# Patient Record
Sex: Female | Born: 1990 | Race: Black or African American | Hispanic: No | Marital: Single
Health system: Southern US, Community
[De-identification: ages and names within clinical notes are randomized; demographics above are authoritative.]

---

## 2016-07-09 ENCOUNTER — Ambulatory Visit
Admission: RE | Admit: 2016-07-09 | Discharge: 2016-07-09 | Disposition: A | Discharge status: Home / Self Care | Payer: 59 | Source: Ambulatory Visit | Attending: Chiropractic Medicine | Admitting: Chiropractic Medicine

## 2016-07-09 ENCOUNTER — Other Ambulatory Visit: Payer: Self-pay | Admitting: Chiropractic Medicine

## 2016-07-09 DIAGNOSIS — M25569 Pain in unspecified knee: Secondary | ICD-10-CM

## 2016-07-09 DIAGNOSIS — R51 Headache: Secondary | ICD-10-CM

## 2016-07-09 DIAGNOSIS — M542 Cervicalgia: Secondary | ICD-10-CM

## 2016-07-09 DIAGNOSIS — R519 Headache, unspecified: Secondary | ICD-10-CM

## 2016-07-09 DIAGNOSIS — M545 Low back pain: Secondary | ICD-10-CM

## 2018-08-05 IMAGING — CR DG LUMBAR SPINE 2-3V
3 series · 3 of 3 positions shown · non-contrast
Comparison: None.

CLINICAL DATA: Low back pain for several months.

EXAM:
LUMBAR SPINE - 2-3 VIEW

[w lumbar spine ap]
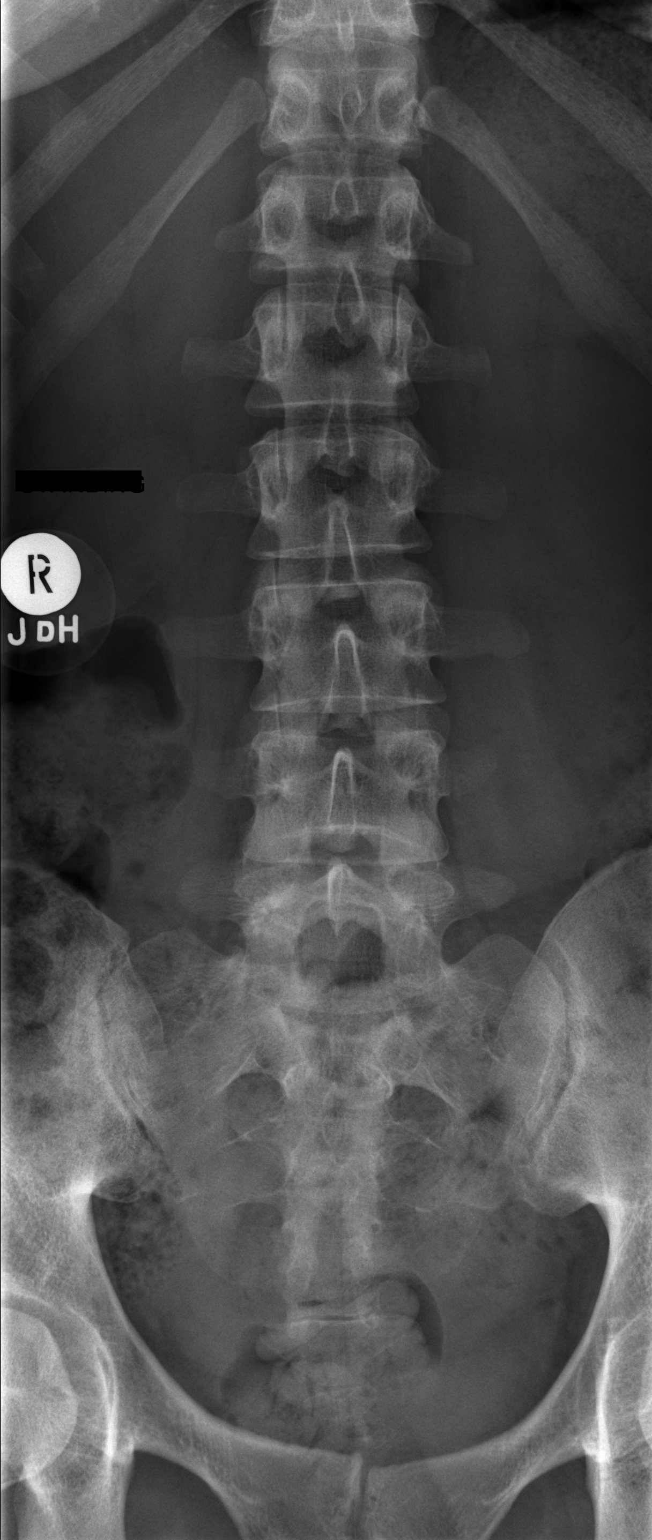

[w lumbar spine lat]
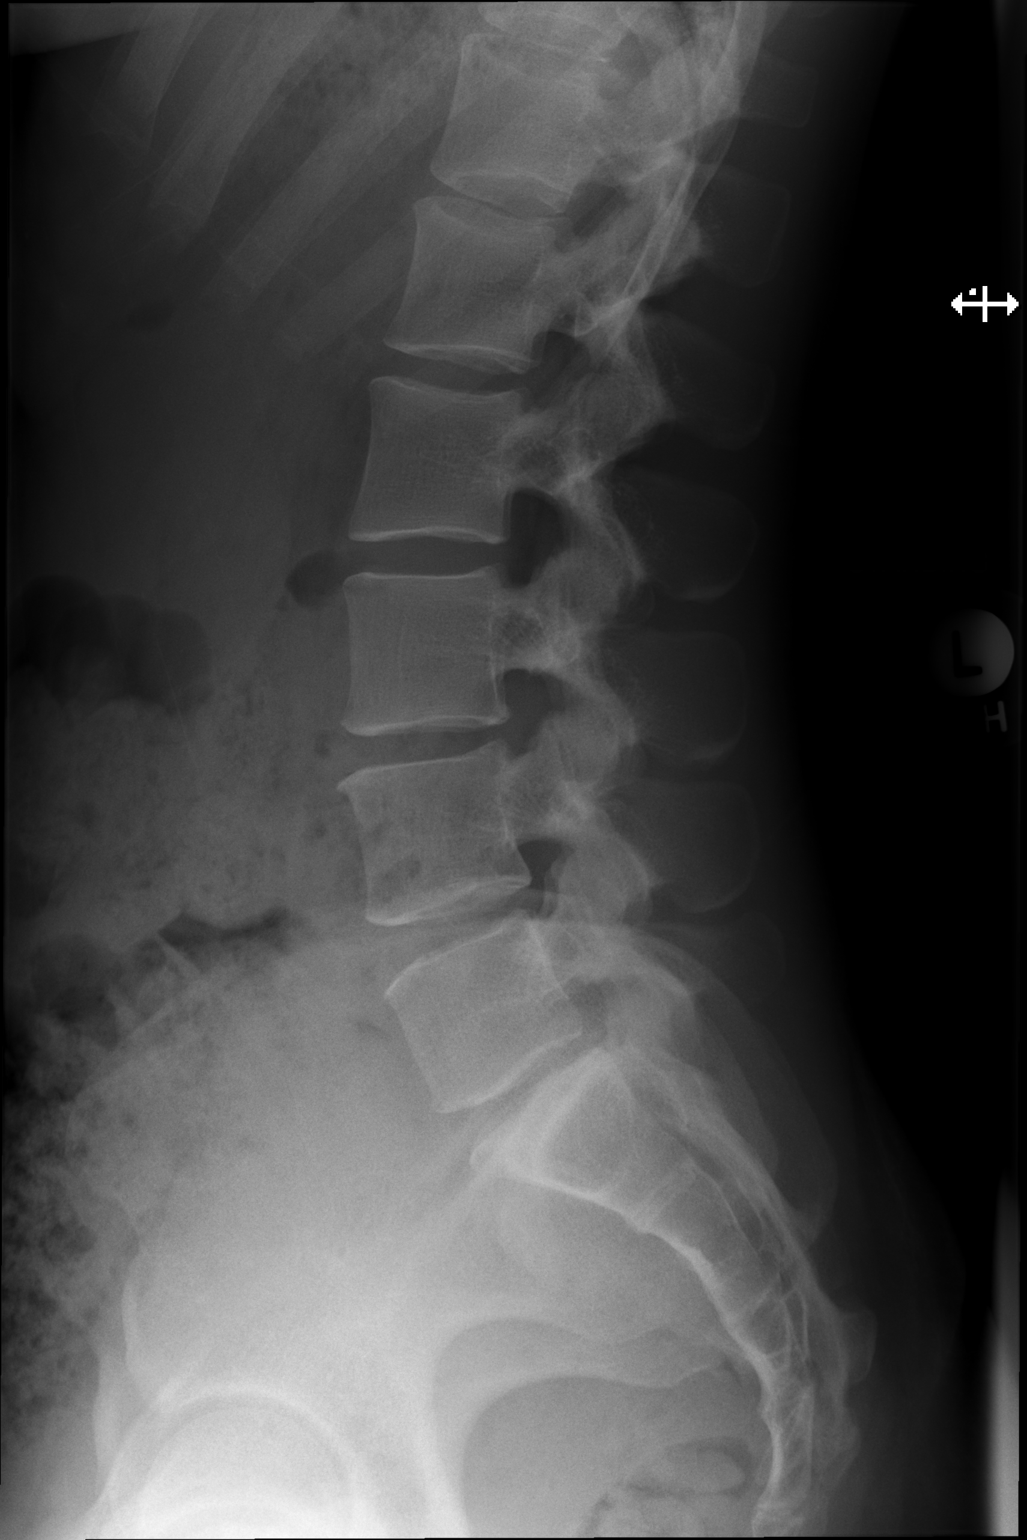

[w lumbar l-5 s-1 spot]
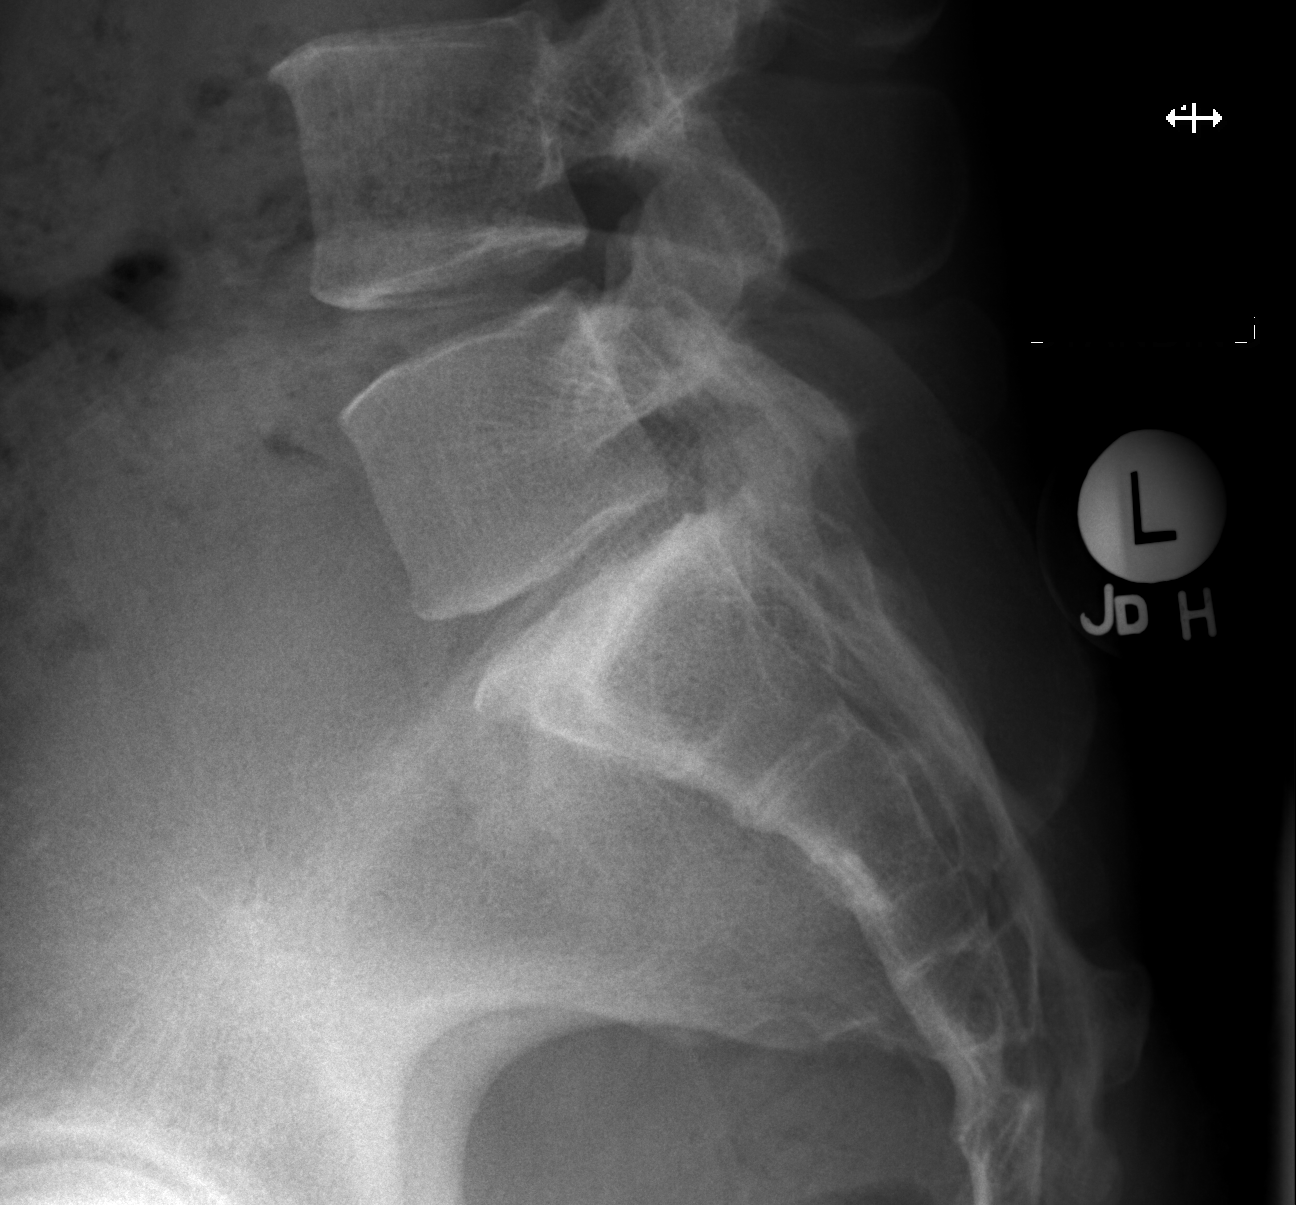

[3 of 3 positions shown; findings below may reference images not displayed]

FINDINGS: Vertebral body height and alignment are maintained. Minimal anterior
endplate spurring is noted at L3-4. There is some irregularity of
subchondral bone about the SI joints bilaterally. Soft tissue
structures appear normal.
IMPRESSION: Very mild degenerative change L3-4.

Likely erosions about the SI joints bilaterally as can be seen in
spondyloarthropathies. Dedicated plain films of the SI joints could
be used for further evaluation.

## 2022-05-27 ENCOUNTER — Ambulatory Visit (INDEPENDENT_AMBULATORY_CARE_PROVIDER_SITE_OTHER): Payer: BC Managed Care – PPO

## 2022-05-27 ENCOUNTER — Ambulatory Visit: Payer: BC Managed Care – PPO | Admitting: Podiatry

## 2022-05-27 DIAGNOSIS — M2011 Hallux valgus (acquired), right foot: Secondary | ICD-10-CM | POA: Diagnosis not present

## 2022-05-27 DIAGNOSIS — M7750 Other enthesopathy of unspecified foot: Secondary | ICD-10-CM

## 2022-05-27 DIAGNOSIS — M21611 Bunion of right foot: Secondary | ICD-10-CM

## 2022-05-27 DIAGNOSIS — M2142 Flat foot [pes planus] (acquired), left foot: Secondary | ICD-10-CM

## 2022-05-27 DIAGNOSIS — M7752 Other enthesopathy of left foot: Secondary | ICD-10-CM

## 2022-05-27 DIAGNOSIS — M2141 Flat foot [pes planus] (acquired), right foot: Secondary | ICD-10-CM

## 2022-05-27 DIAGNOSIS — M2012 Hallux valgus (acquired), left foot: Secondary | ICD-10-CM

## 2022-05-27 DIAGNOSIS — M722 Plantar fascial fibromatosis: Secondary | ICD-10-CM

## 2022-05-27 DIAGNOSIS — M21612 Bunion of left foot: Secondary | ICD-10-CM

## 2022-05-27 DIAGNOSIS — M7751 Other enthesopathy of right foot: Secondary | ICD-10-CM | POA: Diagnosis not present

## 2022-05-27 NOTE — Patient Instructions (Signed)

## 2022-05-28 ENCOUNTER — Encounter: Payer: Self-pay | Admitting: Podiatry

## 2022-05-28 NOTE — Progress Notes (Signed)
  Subjective:  Patient ID: Ebony Walter, female    DOB: 03-22-1991,  MRN: [id number]  Chief Complaint  Patient presents with   Foot Pain    Bilateral arch pain, previous toe and ankle injuries    31 y.o. female presents with the above complaint. History confirmed with patient.  She had injuries on both feet including the hyper plantarflexion on the right side about a year ago she also had a contusion of the top of the left foot about a month ago.  Most of her pain is on the inside of the arch on the bottom  Objective:  Physical Exam: warm, good capillary refill, no trophic changes or ulcerative lesions, normal DP and PT pulses, normal sensory exam, and flexible pes planus deformity with collapse of the medial arch on weightbearing, mild tenderness in the medial band of the plantar fascia with palpation, no pain on PT tendon, she has asymptomatic hallux valgus deformity.   Radiographs: Multiple views x-ray of both feet: no fracture, dislocation, swelling or degenerative changes noted, she has mild hallux valgus and pes planus deformity noted Assessment:   1. Pes planus of both feet   2. Plantar fasciitis, bilateral   3. Hallux valgus with bunions, left   4. Hallux valgus with bunions, right      Plan:  Patient was evaluated and treated and all questions answered.  Discussed the etiology, pathomechanics and treatment options in detail with the patient, we also reviewed today's radiographs in detail.  We discussed how pes planus deformity without pain or functional limitation is quite common and often does not require any treatment.  However when pain or functional limitation arises, treatment with nonsurgical therapy is our first line with stretching, physical therapy, and supportive orthoses.  Also discussed that when these treatments fail patients often do well with surgical treatment of these deformities.  We also discussed the impact of the deformity on soft tissue and the joints and  development of arthritis over time.  Today I recommended that she begin with home physical therapy for strengthening of the medial arch and intrinsic muscles as well as custom molded foot orthoses which will benefit her support her medial longitudinal arch and offload the medial plantar soft tissues.  She was casted for these today.   Return if symptoms worsen or fail to improve.

## 2022-06-26 ENCOUNTER — Ambulatory Visit (INDEPENDENT_AMBULATORY_CARE_PROVIDER_SITE_OTHER): Payer: BC Managed Care – PPO | Admitting: Podiatry

## 2022-06-26 DIAGNOSIS — M722 Plantar fascial fibromatosis: Secondary | ICD-10-CM

## 2022-06-26 DIAGNOSIS — M7750 Other enthesopathy of unspecified foot: Secondary | ICD-10-CM

## 2022-06-26 DIAGNOSIS — M2141 Flat foot [pes planus] (acquired), right foot: Secondary | ICD-10-CM

## 2022-06-26 DIAGNOSIS — M21612 Bunion of left foot: Secondary | ICD-10-CM

## 2022-06-26 DIAGNOSIS — M2142 Flat foot [pes planus] (acquired), left foot: Secondary | ICD-10-CM

## 2022-06-26 DIAGNOSIS — M21611 Bunion of right foot: Secondary | ICD-10-CM

## 2022-06-26 DIAGNOSIS — M2011 Hallux valgus (acquired), right foot: Secondary | ICD-10-CM

## 2022-06-26 DIAGNOSIS — M2012 Hallux valgus (acquired), left foot: Secondary | ICD-10-CM

## 2022-06-26 NOTE — Progress Notes (Signed)
Patient presents today to pick up custom molded foot orthotics recommended by Dr. Lilian Kapur.   Orthotics were dispensed and fit was satisfactory. Reviewed instructions for break-in and wear. Written instructions given to patient.  Patient will follow up as needed.   [facility] - order # [id number]
